# Patient Record
Sex: Female | Born: 1960 | Race: White | Hispanic: No | Marital: Married | Smoking: Never smoker
Health system: Southern US, Community
[De-identification: ages and names within clinical notes are randomized; demographics above are authoritative.]

## PROBLEM LIST (undated history)

## (undated) ENCOUNTER — Ambulatory Visit: Admission: EM | Payer: BC Managed Care – PPO

## (undated) DIAGNOSIS — D649 Anemia, unspecified: Secondary | ICD-10-CM

## (undated) DIAGNOSIS — R7303 Prediabetes: Secondary | ICD-10-CM

## (undated) DIAGNOSIS — K219 Gastro-esophageal reflux disease without esophagitis: Secondary | ICD-10-CM

## (undated) DIAGNOSIS — I10 Essential (primary) hypertension: Secondary | ICD-10-CM

---

## 2014-08-27 ENCOUNTER — Ambulatory Visit: Payer: Self-pay | Admitting: Family Medicine

## 2015-09-29 ENCOUNTER — Ambulatory Visit
Admission: RE | Admit: 2015-09-29 | Discharge: 2015-09-29 | Disposition: A | Discharge status: Home / Self Care | Payer: BC Managed Care – PPO | Source: Ambulatory Visit | Attending: Family Medicine | Admitting: Family Medicine

## 2015-09-29 ENCOUNTER — Other Ambulatory Visit: Payer: Self-pay | Admitting: Family Medicine

## 2015-09-29 DIAGNOSIS — Z1231 Encounter for screening mammogram for malignant neoplasm of breast: Secondary | ICD-10-CM | POA: Insufficient documentation

## 2016-10-04 ENCOUNTER — Other Ambulatory Visit: Payer: Self-pay | Admitting: Family Medicine

## 2016-10-04 DIAGNOSIS — Z1231 Encounter for screening mammogram for malignant neoplasm of breast: Secondary | ICD-10-CM

## 2016-10-12 ENCOUNTER — Ambulatory Visit
Admission: RE | Admit: 2016-10-12 | Discharge: 2016-10-12 | Disposition: A | Discharge status: Home / Self Care | Payer: BC Managed Care – PPO | Source: Ambulatory Visit | Attending: Family Medicine | Admitting: Family Medicine

## 2016-10-12 DIAGNOSIS — Z1231 Encounter for screening mammogram for malignant neoplasm of breast: Secondary | ICD-10-CM | POA: Diagnosis not present

## 2017-10-09 ENCOUNTER — Other Ambulatory Visit: Payer: Self-pay | Admitting: Family Medicine

## 2017-10-09 DIAGNOSIS — Z1231 Encounter for screening mammogram for malignant neoplasm of breast: Secondary | ICD-10-CM

## 2017-10-18 ENCOUNTER — Ambulatory Visit
Admission: RE | Admit: 2017-10-18 | Discharge: 2017-10-18 | Disposition: A | Discharge status: Home / Self Care | Payer: BC Managed Care – PPO | Source: Ambulatory Visit | Attending: Family Medicine | Admitting: Family Medicine

## 2017-10-18 DIAGNOSIS — Z1231 Encounter for screening mammogram for malignant neoplasm of breast: Secondary | ICD-10-CM | POA: Diagnosis not present

## 2018-10-12 ENCOUNTER — Other Ambulatory Visit: Payer: Self-pay | Admitting: Family Medicine

## 2018-10-12 DIAGNOSIS — Z1231 Encounter for screening mammogram for malignant neoplasm of breast: Secondary | ICD-10-CM

## 2018-11-06 ENCOUNTER — Encounter (INDEPENDENT_AMBULATORY_CARE_PROVIDER_SITE_OTHER): Payer: Self-pay

## 2018-11-06 ENCOUNTER — Ambulatory Visit
Admission: RE | Admit: 2018-11-06 | Discharge: 2018-11-06 | Disposition: A | Discharge status: Home / Self Care | Payer: BC Managed Care – PPO | Source: Ambulatory Visit | Attending: Family Medicine | Admitting: Family Medicine

## 2018-11-06 DIAGNOSIS — Z1231 Encounter for screening mammogram for malignant neoplasm of breast: Secondary | ICD-10-CM | POA: Diagnosis present

## 2019-10-31 ENCOUNTER — Other Ambulatory Visit: Payer: Self-pay | Admitting: Family Medicine

## 2019-10-31 DIAGNOSIS — Z1231 Encounter for screening mammogram for malignant neoplasm of breast: Secondary | ICD-10-CM

## 2019-11-12 ENCOUNTER — Ambulatory Visit
Admission: RE | Admit: 2019-11-12 | Discharge: 2019-11-12 | Disposition: A | Discharge status: Home / Self Care | Payer: BC Managed Care – PPO | Source: Ambulatory Visit | Attending: Family Medicine | Admitting: Family Medicine

## 2019-11-12 ENCOUNTER — Other Ambulatory Visit: Payer: Self-pay

## 2019-11-12 DIAGNOSIS — Z1231 Encounter for screening mammogram for malignant neoplasm of breast: Secondary | ICD-10-CM | POA: Insufficient documentation

## 2020-10-13 ENCOUNTER — Other Ambulatory Visit: Payer: Self-pay | Admitting: Family Medicine

## 2020-10-13 DIAGNOSIS — Z1231 Encounter for screening mammogram for malignant neoplasm of breast: Secondary | ICD-10-CM

## 2020-11-17 ENCOUNTER — Other Ambulatory Visit: Payer: Self-pay

## 2020-11-17 ENCOUNTER — Ambulatory Visit
Admission: RE | Admit: 2020-11-17 | Discharge: 2020-11-17 | Disposition: A | Discharge status: Home / Self Care | Payer: BC Managed Care – PPO | Source: Ambulatory Visit | Attending: Family Medicine | Admitting: Family Medicine

## 2020-11-17 DIAGNOSIS — Z1231 Encounter for screening mammogram for malignant neoplasm of breast: Secondary | ICD-10-CM | POA: Diagnosis not present

## 2021-07-29 ENCOUNTER — Other Ambulatory Visit: Payer: Self-pay | Admitting: Orthopedic Surgery

## 2021-09-28 ENCOUNTER — Other Ambulatory Visit: Payer: Self-pay

## 2021-09-28 ENCOUNTER — Encounter (HOSPITAL_BASED_OUTPATIENT_CLINIC_OR_DEPARTMENT_OTHER): Payer: Self-pay | Admitting: Orthopedic Surgery

## 2021-09-29 ENCOUNTER — Encounter (HOSPITAL_BASED_OUTPATIENT_CLINIC_OR_DEPARTMENT_OTHER)
Admission: RE | Admit: 2021-09-29 | Discharge: 2021-09-29 | Disposition: A | Discharge status: Home / Self Care | Payer: BC Managed Care – PPO | Source: Ambulatory Visit | Attending: Orthopedic Surgery | Admitting: Orthopedic Surgery

## 2021-09-29 DIAGNOSIS — Z01810 Encounter for preprocedural cardiovascular examination: Secondary | ICD-10-CM | POA: Insufficient documentation

## 2021-09-29 NOTE — Progress Notes (Signed)

## 2021-10-07 ENCOUNTER — Ambulatory Visit (HOSPITAL_BASED_OUTPATIENT_CLINIC_OR_DEPARTMENT_OTHER): Payer: BC Managed Care – PPO | Admitting: Anesthesiology

## 2021-10-07 ENCOUNTER — Ambulatory Visit (HOSPITAL_BASED_OUTPATIENT_CLINIC_OR_DEPARTMENT_OTHER)
Admission: RE | Admit: 2021-10-07 | Discharge: 2021-10-07 | Disposition: A | Discharge status: Home / Self Care | Payer: BC Managed Care – PPO | Attending: Orthopedic Surgery | Admitting: Orthopedic Surgery

## 2021-10-07 ENCOUNTER — Encounter (HOSPITAL_BASED_OUTPATIENT_CLINIC_OR_DEPARTMENT_OTHER): Payer: Self-pay | Admitting: Orthopedic Surgery

## 2021-10-07 ENCOUNTER — Encounter (HOSPITAL_BASED_OUTPATIENT_CLINIC_OR_DEPARTMENT_OTHER)
Admission: RE | Disposition: A | Discharge status: Home / Self Care | Payer: Self-pay | Source: Home / Self Care | Attending: Orthopedic Surgery

## 2021-10-07 ENCOUNTER — Other Ambulatory Visit: Payer: Self-pay

## 2021-10-07 DIAGNOSIS — G589 Mononeuropathy, unspecified: Secondary | ICD-10-CM | POA: Insufficient documentation

## 2021-10-07 DIAGNOSIS — M189 Osteoarthritis of first carpometacarpal joint, unspecified: Secondary | ICD-10-CM | POA: Insufficient documentation

## 2021-10-07 DIAGNOSIS — G5603 Carpal tunnel syndrome, bilateral upper limbs: Secondary | ICD-10-CM | POA: Diagnosis present

## 2021-10-07 DIAGNOSIS — K219 Gastro-esophageal reflux disease without esophagitis: Secondary | ICD-10-CM | POA: Diagnosis not present

## 2021-10-07 DIAGNOSIS — I10 Essential (primary) hypertension: Secondary | ICD-10-CM | POA: Diagnosis not present

## 2021-10-07 HISTORY — DX: Prediabetes: R73.03

## 2021-10-07 HISTORY — DX: Gastro-esophageal reflux disease without esophagitis: K21.9

## 2021-10-07 HISTORY — PX: CARPAL TUNNEL RELEASE: SHX101

## 2021-10-07 HISTORY — DX: Anemia, unspecified: D64.9

## 2021-10-07 HISTORY — DX: Essential (primary) hypertension: I10

## 2021-10-07 LAB — GLUCOSE, CAPILLARY: Glucose-Capillary: 80 mg/dL (ref 70–99)

## 2021-10-07 SURGERY — CARPAL TUNNEL RELEASE
Anesthesia: Regional | Site: Wrist | Laterality: Right

## 2021-10-07 MED ORDER — ONDANSETRON HCL 4 MG/2ML IJ SOLN
INTRAMUSCULAR | Status: DC | PRN
  Administered 2021-10-07: 4 mg via INTRAVENOUS

## 2021-10-07 MED ORDER — PROPOFOL 500 MG/50ML IV EMUL
INTRAVENOUS | Status: DC | PRN
  Administered 2021-10-07: 100 ug/kg/min via INTRAVENOUS

## 2021-10-07 MED ORDER — MIDAZOLAM HCL 2 MG/2ML IJ SOLN
INTRAMUSCULAR | Status: AC
  Filled 2021-10-07: qty 2

## 2021-10-07 MED ORDER — MIDAZOLAM HCL 2 MG/2ML IJ SOLN
2.0000 mg | Freq: Once | INTRAMUSCULAR | Status: AC
  Administered 2021-10-07: 09:00:00 2 mg via INTRAVENOUS

## 2021-10-07 MED ORDER — MEPERIDINE HCL 25 MG/ML IJ SOLN: 6.2500 mg | INTRAMUSCULAR | Status: DC | PRN

## 2021-10-07 MED ORDER — DEXAMETHASONE SODIUM PHOSPHATE 4 MG/ML IJ SOLN
INTRAMUSCULAR | Status: DC | PRN
  Administered 2021-10-07: 5 mg via PERINEURAL

## 2021-10-07 MED ORDER — CLONIDINE HCL (ANALGESIA) 100 MCG/ML EP SOLN
EPIDURAL | Status: DC | PRN
  Administered 2021-10-07: 80 ug

## 2021-10-07 MED ORDER — FENTANYL CITRATE (PF) 100 MCG/2ML IJ SOLN: 25.0000 ug | INTRAMUSCULAR | Status: DC | PRN

## 2021-10-07 MED ORDER — PROMETHAZINE HCL 25 MG/ML IJ SOLN: 6.2500 mg | INTRAMUSCULAR | Status: DC | PRN

## 2021-10-07 MED ORDER — ROPIVACAINE HCL 5 MG/ML IJ SOLN: INTRAMUSCULAR | Status: DC | PRN

## 2021-10-07 MED ORDER — HYDROCODONE-ACETAMINOPHEN 5-325 MG PO TABS: 1.0000 | ORAL_TABLET | Freq: Four times a day (QID) | ORAL | 0 refills | Status: DC | PRN

## 2021-10-07 MED ORDER — FENTANYL CITRATE (PF) 100 MCG/2ML IJ SOLN
100.0000 ug | Freq: Once | INTRAMUSCULAR | Status: AC
  Administered 2021-10-07: 09:00:00 50 ug via INTRAVENOUS

## 2021-10-07 MED ORDER — PROPOFOL 10 MG/ML IV BOLUS
INTRAVENOUS | Status: AC
  Filled 2021-10-07: qty 20

## 2021-10-07 MED ORDER — CEFAZOLIN SODIUM-DEXTROSE 2-4 GM/100ML-% IV SOLN
2.0000 g | INTRAVENOUS | Status: AC
  Administered 2021-10-07: 10:00:00 2 g via INTRAVENOUS

## 2021-10-07 MED ORDER — LACTATED RINGERS IV SOLN: INTRAVENOUS | Status: DC

## 2021-10-07 MED ORDER — ROPIVACAINE HCL 5 MG/ML IJ SOLN
INTRAMUSCULAR | Status: DC | PRN
  Administered 2021-10-07: 35 mL via PERINEURAL

## 2021-10-07 MED ORDER — PROPOFOL 500 MG/50ML IV EMUL
INTRAVENOUS | Status: AC
  Filled 2021-10-07: qty 50

## 2021-10-07 MED ORDER — ONDANSETRON HCL 4 MG/2ML IJ SOLN
INTRAMUSCULAR | Status: AC
  Filled 2021-10-07: qty 2

## 2021-10-07 MED ORDER — PROPOFOL 10 MG/ML IV BOLUS
INTRAVENOUS | Status: DC | PRN
  Administered 2021-10-07: 20 mg via INTRAVENOUS

## 2021-10-07 MED ORDER — FENTANYL CITRATE (PF) 100 MCG/2ML IJ SOLN
INTRAMUSCULAR | Status: AC
  Filled 2021-10-07: qty 2

## 2021-10-07 SURGICAL SUPPLY — 34 items
APL PRP STRL LF DISP 70% ISPRP (MISCELLANEOUS) ×1
BLADE SURG 15 STRL LF DISP TIS (BLADE) ×1 IMPLANT
BLADE SURG 15 STRL SS (BLADE) ×4
BNDG CMPR 9X4 STRL LF SNTH (GAUZE/BANDAGES/DRESSINGS) ×1
BNDG COHESIVE 3X5 TAN ST LF (GAUZE/BANDAGES/DRESSINGS) ×2 IMPLANT
BNDG ESMARK 4X9 LF (GAUZE/BANDAGES/DRESSINGS) ×1 IMPLANT
BNDG GAUZE ELAST 4 BULKY (GAUZE/BANDAGES/DRESSINGS) ×2 IMPLANT
CHLORAPREP W/TINT 26 (MISCELLANEOUS) ×2 IMPLANT
CORD BIPOLAR FORCEPS 12FT (ELECTRODE) ×2 IMPLANT
COVER BACK TABLE 60X90IN (DRAPES) ×2 IMPLANT
COVER MAYO STAND STRL (DRAPES) ×2 IMPLANT
CUFF TOURN SGL QUICK 18X4 (TOURNIQUET CUFF) ×2 IMPLANT
DRAPE EXTREMITY T 121X128X90 (DISPOSABLE) ×2 IMPLANT
DRAPE SURG 17X23 STRL (DRAPES) ×2 IMPLANT
DRSG PAD ABDOMINAL 8X10 ST (GAUZE/BANDAGES/DRESSINGS) ×2 IMPLANT
GAUZE SPONGE 4X4 12PLY STRL (GAUZE/BANDAGES/DRESSINGS) ×2 IMPLANT
GAUZE XEROFORM 1X8 LF (GAUZE/BANDAGES/DRESSINGS) ×2 IMPLANT
GLOVE SURG ORTHO LTX SZ8 (GLOVE) ×2 IMPLANT
GLOVE SURG UNDER POLY LF SZ8.5 (GLOVE) ×2 IMPLANT
GOWN STRL REUS W/ TWL LRG LVL3 (GOWN DISPOSABLE) ×1 IMPLANT
GOWN STRL REUS W/TWL LRG LVL3 (GOWN DISPOSABLE) ×2
GOWN STRL REUS W/TWL XL LVL3 (GOWN DISPOSABLE) ×2 IMPLANT
NDL HYPO 27GX1-1/4 (NEEDLE) IMPLANT
NEEDLE HYPO 27GX1-1/4 (NEEDLE) IMPLANT
NS IRRIG 1000ML POUR BTL (IV SOLUTION) ×2 IMPLANT
PACK BASIN DAY SURGERY FS (CUSTOM PROCEDURE TRAY) ×2 IMPLANT
STOCKINETTE 4X48 STRL (DRAPES) ×2 IMPLANT
SUT ETHILON 4 0 PS 2 18 (SUTURE) ×2 IMPLANT
SUT MERSILENE 4 0 P 3 (SUTURE) ×1 IMPLANT
SUT VICRYL 4-0 PS2 18IN ABS (SUTURE) IMPLANT
SYR BULB EAR ULCER 3OZ GRN STR (SYRINGE) ×2 IMPLANT
SYR CONTROL 10ML LL (SYRINGE) IMPLANT
TOWEL GREEN STERILE FF (TOWEL DISPOSABLE) ×2 IMPLANT
UNDERPAD 30X36 HEAVY ABSORB (UNDERPADS AND DIAPERS) ×2 IMPLANT

## 2021-10-07 NOTE — H&P (Signed)
Sherry Rowe is an 60 y.o. female.   Chief Complaint: numbness and weakness right hand HPI: Sherry Rowe is a 60 year old right-hand-dominant female referred from [facility] for consultation regarding bilateral numbness and tingling. This been going on for 25 years since her last pregnancy. She was told that it would get better. She states it has never gotten better. She is not dropping things. She has burning her fingers. She states driving causes symptoms for her. I have taken care of her sister of her carpal tunnel release. She has had nerve conductions done revealing severe carpal tunnel syndrome bilaterally. She states right is worse than left with thumb through ring fingers constantly numb. She has no history of injury to the hand or to the neck no history of car accidents. She is not awakened at night. She has not taking anything at the present time. She has had nerve conductions done at [facility] revealing no response either motor or sensory components of the nerve. She states that she has difficulty picking things up. She has a history of arthritis no history of diabetes thyroid problems or gout. Family history is positive for arthritis.  Past Medical History:  Diagnosis Date   Anemia    GERD (gastroesophageal reflux disease)    HTN (hypertension)    Prediabetes     Past Surgical History:  Procedure Laterality Date   ANTERIOR CRUCIATE LIGAMENT REPAIR     ROTATOR CUFF REPAIR      Family History  Problem Relation Age of Onset   Breast cancer Paternal Aunt    Social History:  reports that she has never smoked. She has never used smokeless tobacco. She reports that she does not drink alcohol and does not use drugs.  Allergies: No Known Allergies  No medications prior to admission.    No results found for this or any previous visit (from the past 48 hour(s)).  No results found.   Pertinent items are noted in HPI.  Height 4\' 9"  (1.448 m), weight 65.8 kg, last menstrual period  10/21/2018.  General appearance: alert, cooperative, and appears stated age Head: Normocephalic, without obvious abnormality Neck: no JVD Resp: clear to auscultation bilaterally Cardio: regular rate and rhythm, S1, S2 normal, no murmur, click, rub or gallop GI: soft, non-tender; bowel sounds normal; no masses,  no organomegaly Extremities: numbness and weakness right hand Pulses: 2+ and symmetric Skin: Skin color, texture, turgor normal. No rashes or lesions Neurologic: Alert and oriented X 3, normal strength and tone. Normal symmetric reflexes. 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 She would like to proceed to have that done. Her nerve conductions and reports from [facility] are reviewed on epic and on paper we have discussed tendon transfers for opposition using palmaris longus as a Camitz transfer. Pre-. Postoperative course been discussed along with risk and complications. She is advised to reattempting to halt the process trying with the tendon transfer to improve her opposition. She is advised that there is no guarantee to the surgery possibility of infection recurrence injury to arteries nerves tendons complete relief symptoms and dystrophy. She is seen with her husband. She would like to proceed on the right side and will be scheduled for carpal tunnel release with a Camitz transfer right hand as an outpatient under regional anesthesia.  12/20/2018 10/07/2021, 5:12 AM

## 2021-10-07 NOTE — Anesthesia Preprocedure Evaluation (Addendum)
Anesthesia Evaluation  Patient identified by MRN, date of birth, ID band Patient awake    Reviewed: Allergy & Precautions, NPO status , Patient's Chart, lab work & pertinent test results  Airway Mallampati: III  TM Distance: >3 FB Neck ROM: Full    Dental no notable dental hx. (+) Dental Advisory Given   Pulmonary neg pulmonary ROS,    Pulmonary exam normal breath sounds clear to auscultation       Cardiovascular hypertension, negative cardio ROS Normal cardiovascular exam Rhythm:Regular Rate:Normal     Neuro/Psych negative neurological ROS     GI/Hepatic Neg liver ROS, GERD  ,  Endo/Other  negative endocrine ROS  Renal/GU negative Renal ROS     Musculoskeletal negative musculoskeletal ROS (+)   Abdominal (+) + obese,   Peds  Hematology  (+) Blood dyscrasia, anemia ,   Anesthesia Other Findings   Reproductive/Obstetrics                           Anesthesia Physical Anesthesia Plan  ASA: 2  Anesthesia Plan: Regional   Post-op Pain Management: Regional block and Minimal or no pain anticipated   Induction: Intravenous  PONV Risk Score and Plan: 2 and Treatment may vary due to age or medical condition, Ondansetron, Propofol infusion, TIVA and Midazolam  Airway Management Planned: Natural Airway  Additional Equipment: None  Intra-op Plan:   Post-operative Plan:   Informed Consent: I have reviewed the patients History and Physical, chart, labs and discussed the procedure including the risks, benefits and alternatives for the proposed anesthesia with the patient or authorized representative who has indicated his/her understanding and acceptance.     Dental advisory given  Plan Discussed with: CRNA  Anesthesia Plan Comments:        Anesthesia Quick Evaluation

## 2021-10-07 NOTE — Progress Notes (Signed)
Assisted Dr. Germeroth with right, ultrasound guided, axillary block. Side rails up, monitors on throughout procedure. See vital signs in flow sheet. Tolerated Procedure well. 

## 2021-10-07 NOTE — Discharge Instructions (Addendum)

## 2021-10-07 NOTE — Brief Op Note (Signed)
10/07/2021  10:22 AM  PATIENT:  Sherry Rowe  60 y.o. female  PRE-OPERATIVE DIAGNOSIS:  right carpal tunnel syndrome  POST-OPERATIVE DIAGNOSIS:  right carpal tunnel syndrome  PROCEDURE:  Procedure(s) with comments: CARPAL TUNNEL RELEASE, TRANSFER CAMITZ, RIGHT (Right) - right carpal tunnel release, transfer camitz right  SURGEON:  Surgeon(s) and Role:    * Cindee Salt, MD - Primary    * Betha Loa, MD  PHYSICIAN ASSISTANT:   ASSISTANTS: K KuzmaMD  ANESTHESIA:   regional and IV sedation  EBL: 1 mL  BLOOD ADMINISTERED:none  DRAINS: none   LOCAL MEDICATIONS USED:  none  SPECIMEN:  No Specimen  DISPOSITION OF SPECIMEN: na  COUNTS:  YES  TOURNIQUET:   Total Tourniquet Time Documented: Upper Arm (Right) - 32 minutes Total: Upper Arm (Right) - 32 minutes   DICTATION: .Reubin Milan Dictation  PLAN OF CARE: Discharge to home after PACU  PATIENT DISPOSITION:  PACU - hemodynamically stable.

## 2021-10-07 NOTE — Op Note (Signed)
NAME: Sherry Rowe MEDICAL RECORD NO: [id number] DATE OF BIRTH: 06-26-1961 FACILITY: [facility] LOCATION: [facility] PHYSICIAN: Nicki Reaper, MD   OPERATIVE REPORT   DATE OF PROCEDURE: 10/07/21    PREOPERATIVE DIAGNOSIS: Tunnel syndrome bilateral with marked thenar atrophy bilateral   POSTOPERATIVE DIAGNOSIS: Right carpal tunnel with marked thenar atrophy   PROCEDURE: Carpal tunnel release with Camnitz transfer palmaris longus to abductor pollicis brevis   SURGEON: Cindee Salt, M.D.   ASSISTANT: Betha Loa, MD   ANESTHESIA:  Regional with sedation   INTRAVENOUS FLUIDS:  Per anesthesia flow sheet.   ESTIMATED BLOOD LOSS:  Minimal.   COMPLICATIONS:  None.   SPECIMENS:  none   TOURNIQUET TIME:    Total Tourniquet Time Documented: Upper Arm (Right) - 32 minutes Total: Upper Arm (Right) - 32 minutes    DISPOSITION:  Stable to PACU.   INDICATIONS: Patient is a 60 year old female with a history of numbness tingling with marked atrophy to the thenar eminence and carpal tunnel syndrome bilateral hands.  Nerve conductions are positive.  She is elected undergo decompression of the median nerve with Camnitz transfer.  Pre-.  Postoperative course been discussed along with risk complications.  She is aware that there is no guarantee to the surgery the possibility of infection recurrence injury to arteries nerves tendons complete relief symptoms and dystrophy.  Preoperative.  Patient seen extremity marked by both patient and surgeon antibiotic given.  An axillary block was carried out without difficulty under the direction the anesthesia department.  OPERATIVE COURSE: Patient is brought the operating room placed in a supine position with the right arm free.  Prep was done with ChloraPrep.  A 3-minute dry time was allowed and timeout taken confirm patient procedure.  The right arm was exsanguinated with an Esmarch bandage turn placed on the arm was inflated to 250  mmHg.  A longitudinal incision was made in the right palm carried down through subcutaneous tissue.  Bleeders were electrocauterized with bipolar.  The palmar fascia was identified and dissected free from the underlying tissue including branches to the index middle and ring and small fingers.  This was then isolated proximally.  Left connected to the palmaris longus tendon.  This was freed.  The flexor tendon of the ring little finger was then identified at the superficial palmar arch.  Retractors were placed retracting median nerve radially ulnar nerve ulnarly and the flexor retinaculum was then released on its ulnar border.  A right angle and sect see and sewell retractor were placed between skin and forearm fascia.  The deep structures dissected free with blunt dissection.  Flexor retinaculum proximally distal forearm fascia was then released approximately 2 to 3 cm proximal to the wrist crease under direct vision.  The palmaris longus and fascia were then circularized.  A tunnel was then made to the metacarpal phalangeal joint subcutaneously.  An incision was made on the radial aspect of the metacarpal phalangeal joint.  The palmaris longus graft was then brought to the abductor pollicis brevis insertion.  The palm incision was irrigated and closed interrupted 4-0 nylon sutures.  The palmaris longus palmar fascia tendon was then woven through the insertion of the abductor pollicis longus with the thumb held in opposition.  And sutured to the extensor tendon and then to itself with interrupted figure-of-eight horizontal mattress 4-0 Mersilene sutures.  The wound was closed with interrupted 4-0 nylon sutures.  A sterile compressive dressing dorsal thumb spica and palmar wrist splint were  applied.  On Deflation of the tourniquet all fingers immediately pink.  She was taken to the recovery room for observation in satisfactory condition.  She will be discharged home to return to the hand center [facility] in 1 week  Tylenol ibuprofen for pain with Norco for breakthrough.   Cindee Salt, MD Electronically signed, 10/07/21

## 2021-10-07 NOTE — Anesthesia Postprocedure Evaluation (Signed)
Anesthesia Post Note  Patient: Forensic psychologist  Procedure(s) Performed: CARPAL TUNNEL RELEASE, TRANSFER CAMITZ, RIGHT (Right: Wrist)     Patient location during evaluation: PACU Anesthesia Type: Regional Level of consciousness: awake and alert Pain management: pain level controlled Vital Signs Assessment: post-procedure vital signs reviewed and stable Respiratory status: spontaneous breathing Cardiovascular status: stable Anesthetic complications: no   No notable events documented.  Last Vitals:  Vitals:   10/07/21 1045 10/07/21 1100  BP: 112/73 120/68  Pulse: (!) 57 (!) 59  Resp: 11 16  Temp:  (!) 36.4 C  SpO2: 98% 95%    Last Pain:  Vitals:   10/07/21 1100  TempSrc:   PainSc: 0-No pain                 Sherry Rowe

## 2021-10-07 NOTE — Op Note (Signed)
I assisted Surgeon(s) and Role:    * Cindee Salt, MD - Primary    * Betha Loa, MD on the Procedure(s): CARPAL TUNNEL RELEASE, TRANSFER CAMITZ, RIGHT on 10/07/2021.  I provided assistance on this case as follows: retraction soft tissues, passage tendon transfer.  Electronically signed by: Betha Loa, MD Date: 10/07/2021 Time: 10:24 AM

## 2021-10-07 NOTE — Transfer of Care (Signed)
Immediate Anesthesia Transfer of Care Note  Patient: Sherry Rowe  Procedure(s) Performed: CARPAL TUNNEL RELEASE, TRANSFER CAMITZ, RIGHT (Right: Wrist)  Patient Location: PACU  Anesthesia Type:MAC combined with regional for post-op pain  Level of Consciousness: awake, alert  and oriented  Airway & Oxygen Therapy: Patient Spontanous Breathing and Patient connected to face mask oxygen  Post-op Assessment: Report given to RN and Post -op Vital signs reviewed and stable  Post vital signs: Reviewed and stable  Last Vitals:  Vitals Value Taken Time  BP    Temp 36.4 C 10/07/21 1027  Pulse 54 10/07/21 1028  Resp 11 10/07/21 1028  SpO2 99 % 10/07/21 1028  Vitals shown include unvalidated device data.  Last Pain:  Vitals:   10/07/21 0905  TempSrc: Oral  PainSc: 0-No pain         Complications: No notable events documented.

## 2021-10-07 NOTE — Anesthesia Procedure Notes (Addendum)
Anesthesia Regional Block: Axillary brachial plexus block   Pre-Anesthetic Checklist: , timeout performed,  Correct Patient, Correct Site, Correct Laterality,  Correct Procedure, Correct Position, site marked,  Risks and benefits discussed,  Surgical consent,  Pre-op evaluation,  At surgeon's request and post-op pain management  Laterality: Upper and Right  Prep: chloraprep       Needles:  Injection technique: Single-shot  Needle Type: Stimiplex          Additional Needles:   Procedures:,,,, ultrasound used (permanent image in chart),,    Narrative:  Start time: 10/07/2021 9:00 AM End time: 10/07/2021 9:19 AM Injection made incrementally with aspirations every 5 mL.  Performed by: Personally  Anesthesiologist: Lewie Loron, MD  Additional Notes: BP cuff, SpO2 and EKG monitors applied. Sedation begun. Nerve location verified with ultrasound. Anesthetic injected incrementally, slowly, and after neg aspirations under direct u/s guidance. Good perineural spread. Tolerated well.

## 2021-10-12 ENCOUNTER — Encounter (HOSPITAL_BASED_OUTPATIENT_CLINIC_OR_DEPARTMENT_OTHER): Payer: Self-pay | Admitting: Orthopedic Surgery

## 2021-10-26 ENCOUNTER — Other Ambulatory Visit: Payer: Self-pay | Admitting: Family Medicine

## 2021-10-26 DIAGNOSIS — Z1231 Encounter for screening mammogram for malignant neoplasm of breast: Secondary | ICD-10-CM

## 2021-11-24 ENCOUNTER — Ambulatory Visit: Payer: BC Managed Care – PPO

## 2022-01-17 ENCOUNTER — Ambulatory Visit
Admission: RE | Admit: 2022-01-17 | Discharge: 2022-01-17 | Disposition: A | Discharge status: Home / Self Care | Payer: BC Managed Care – PPO | Source: Ambulatory Visit | Attending: Family Medicine | Admitting: Family Medicine

## 2022-01-17 DIAGNOSIS — Z1231 Encounter for screening mammogram for malignant neoplasm of breast: Secondary | ICD-10-CM | POA: Insufficient documentation

## 2022-04-15 ENCOUNTER — Other Ambulatory Visit: Payer: Self-pay | Admitting: Orthopedic Surgery

## 2022-04-18 ENCOUNTER — Other Ambulatory Visit: Payer: Self-pay

## 2022-04-18 ENCOUNTER — Encounter (HOSPITAL_BASED_OUTPATIENT_CLINIC_OR_DEPARTMENT_OTHER): Payer: Self-pay | Admitting: Orthopedic Surgery

## 2022-04-28 ENCOUNTER — Ambulatory Visit (HOSPITAL_BASED_OUTPATIENT_CLINIC_OR_DEPARTMENT_OTHER): Payer: BC Managed Care – PPO | Admitting: Anesthesiology

## 2022-04-28 ENCOUNTER — Other Ambulatory Visit: Payer: Self-pay

## 2022-04-28 ENCOUNTER — Encounter (HOSPITAL_BASED_OUTPATIENT_CLINIC_OR_DEPARTMENT_OTHER): Payer: Self-pay | Admitting: Orthopedic Surgery

## 2022-04-28 ENCOUNTER — Encounter (HOSPITAL_BASED_OUTPATIENT_CLINIC_OR_DEPARTMENT_OTHER)
Admission: RE | Disposition: A | Discharge status: Home / Self Care | Payer: Self-pay | Source: Ambulatory Visit | Attending: Orthopedic Surgery

## 2022-04-28 ENCOUNTER — Ambulatory Visit (HOSPITAL_BASED_OUTPATIENT_CLINIC_OR_DEPARTMENT_OTHER)
Admission: RE | Admit: 2022-04-28 | Discharge: 2022-04-28 | Disposition: A | Discharge status: Home / Self Care | Payer: BC Managed Care – PPO | Source: Ambulatory Visit | Attending: Orthopedic Surgery | Admitting: Orthopedic Surgery

## 2022-04-28 DIAGNOSIS — G5602 Carpal tunnel syndrome, left upper limb: Secondary | ICD-10-CM | POA: Insufficient documentation

## 2022-04-28 DIAGNOSIS — M62542 Muscle wasting and atrophy, not elsewhere classified, left hand: Secondary | ICD-10-CM | POA: Insufficient documentation

## 2022-04-28 DIAGNOSIS — I10 Essential (primary) hypertension: Secondary | ICD-10-CM | POA: Insufficient documentation

## 2022-04-28 DIAGNOSIS — Z9889 Other specified postprocedural states: Secondary | ICD-10-CM | POA: Diagnosis not present

## 2022-04-28 DIAGNOSIS — Z79899 Other long term (current) drug therapy: Secondary | ICD-10-CM | POA: Insufficient documentation

## 2022-04-28 HISTORY — PX: TENDON TRANSFER: SHX6109

## 2022-04-28 HISTORY — PX: CARPAL TUNNEL RELEASE: SHX101

## 2022-04-28 SURGERY — CARPAL TUNNEL RELEASE
Anesthesia: Monitor Anesthesia Care | Site: Hand | Laterality: Left

## 2022-04-28 MED ORDER — MIDAZOLAM HCL 2 MG/2ML IJ SOLN
2.0000 mg | Freq: Once | INTRAMUSCULAR | Status: AC
  Administered 2022-04-28: 2 mg via INTRAVENOUS

## 2022-04-28 MED ORDER — MIDAZOLAM HCL 2 MG/2ML IJ SOLN
INTRAMUSCULAR | Status: AC
  Filled 2022-04-28: qty 2

## 2022-04-28 MED ORDER — ONDANSETRON HCL 4 MG/2ML IJ SOLN
INTRAMUSCULAR | Status: AC
  Filled 2022-04-28: qty 2

## 2022-04-28 MED ORDER — ACETAMINOPHEN 500 MG PO TABS
ORAL_TABLET | ORAL | Status: AC
  Filled 2022-04-28: qty 2

## 2022-04-28 MED ORDER — VANCOMYCIN HCL IN DEXTROSE 1-5 GM/200ML-% IV SOLN
1000.0000 mg | INTRAVENOUS | Status: AC
  Administered 2022-04-28: 1000 mg via INTRAVENOUS

## 2022-04-28 MED ORDER — FENTANYL CITRATE (PF) 100 MCG/2ML IJ SOLN
INTRAMUSCULAR | Status: AC
  Filled 2022-04-28: qty 2

## 2022-04-28 MED ORDER — FENTANYL CITRATE (PF) 100 MCG/2ML IJ SOLN: 25.0000 ug | INTRAMUSCULAR | Status: DC | PRN

## 2022-04-28 MED ORDER — 0.9 % SODIUM CHLORIDE (POUR BTL) OPTIME
TOPICAL | Status: DC | PRN
  Administered 2022-04-28: 200 mL

## 2022-04-28 MED ORDER — VANCOMYCIN HCL IN DEXTROSE 1-5 GM/200ML-% IV SOLN
INTRAVENOUS | Status: AC
  Filled 2022-04-28: qty 200

## 2022-04-28 MED ORDER — LACTATED RINGERS IV SOLN: INTRAVENOUS | Status: DC

## 2022-04-28 MED ORDER — ACETAMINOPHEN 500 MG PO TABS
1000.0000 mg | ORAL_TABLET | Freq: Four times a day (QID) | ORAL | Status: DC | PRN
  Administered 2022-04-28: 1000 mg via ORAL

## 2022-04-28 MED ORDER — FENTANYL CITRATE (PF) 100 MCG/2ML IJ SOLN
100.0000 ug | Freq: Once | INTRAMUSCULAR | Status: AC
  Administered 2022-04-28: 50 ug via INTRAVENOUS

## 2022-04-28 MED ORDER — BUPIVACAINE-EPINEPHRINE (PF) 0.5% -1:200000 IJ SOLN
INTRAMUSCULAR | Status: DC | PRN
  Administered 2022-04-28: 30 mL via PERINEURAL

## 2022-04-28 MED ORDER — CELECOXIB 200 MG PO CAPS
ORAL_CAPSULE | ORAL | Status: AC
  Filled 2022-04-28: qty 1

## 2022-04-28 MED ORDER — PROPOFOL 500 MG/50ML IV EMUL
INTRAVENOUS | Status: DC | PRN
  Administered 2022-04-28: 50 ug/kg/min via INTRAVENOUS

## 2022-04-28 MED ORDER — EPHEDRINE SULFATE (PRESSORS) 50 MG/ML IJ SOLN
INTRAMUSCULAR | Status: DC | PRN
  Administered 2022-04-28 (×2): 5 mg via INTRAVENOUS
  Administered 2022-04-28: 10 mg via INTRAVENOUS

## 2022-04-28 MED ORDER — LIDOCAINE-EPINEPHRINE (PF) 1.5 %-1:200000 IJ SOLN
INTRAMUSCULAR | Status: DC | PRN
  Administered 2022-04-28: 5 mL

## 2022-04-28 MED ORDER — LIDOCAINE 2% (20 MG/ML) 5 ML SYRINGE
INTRAMUSCULAR | Status: AC
  Filled 2022-04-28: qty 5

## 2022-04-28 MED ORDER — PROPOFOL 10 MG/ML IV BOLUS
INTRAVENOUS | Status: AC
  Filled 2022-04-28: qty 20

## 2022-04-28 MED ORDER — PROPOFOL 10 MG/ML IV BOLUS
INTRAVENOUS | Status: DC | PRN
  Administered 2022-04-28: 30 mg via INTRAVENOUS

## 2022-04-28 MED ORDER — CELECOXIB 200 MG PO CAPS
200.0000 mg | ORAL_CAPSULE | Freq: Once | ORAL | Status: AC
  Administered 2022-04-28: 200 mg via ORAL

## 2022-04-28 MED ORDER — LIDOCAINE HCL (CARDIAC) PF 100 MG/5ML IV SOSY
PREFILLED_SYRINGE | INTRAVENOUS | Status: DC | PRN
  Administered 2022-04-28: 20 mg via INTRAVENOUS

## 2022-04-28 MED ORDER — HYDROCODONE-ACETAMINOPHEN 5-325 MG PO TABS
ORAL_TABLET | ORAL | 0 refills | Status: AC
Start: 2022-04-28 — End: ?

## 2022-04-28 MED ORDER — EPHEDRINE 5 MG/ML INJ
INTRAVENOUS | Status: AC
  Filled 2022-04-28: qty 5

## 2022-04-28 MED ORDER — AMISULPRIDE (ANTIEMETIC) 5 MG/2ML IV SOLN: 10.0000 mg | Freq: Once | INTRAVENOUS | Status: DC | PRN

## 2022-04-28 MED ORDER — CLONIDINE HCL (ANALGESIA) 100 MCG/ML EP SOLN
EPIDURAL | Status: DC | PRN
  Administered 2022-04-28: 50 ug

## 2022-04-28 SURGICAL SUPPLY — 49 items
APL PRP STRL LF DISP 70% ISPRP (MISCELLANEOUS) ×1
BLADE SURG 15 STRL LF DISP TIS (BLADE) ×2 IMPLANT
BLADE SURG 15 STRL SS (BLADE) ×4
BNDG CMPR 9X4 STRL LF SNTH (GAUZE/BANDAGES/DRESSINGS) ×1
BNDG ELASTIC 3X5.8 VLCR STR LF (GAUZE/BANDAGES/DRESSINGS) ×2 IMPLANT
BNDG ESMARK 4X9 LF (GAUZE/BANDAGES/DRESSINGS) ×2 IMPLANT
BNDG GAUZE DERMACEA FLUFF (GAUZE/BANDAGES/DRESSINGS) ×1
BNDG GAUZE DERMACEA FLUFF 4 (GAUZE/BANDAGES/DRESSINGS) ×1 IMPLANT
BNDG GZE DERMACEA 4 6PLY (GAUZE/BANDAGES/DRESSINGS) ×1
CHLORAPREP W/TINT 26 (MISCELLANEOUS) ×2 IMPLANT
CORD BIPOLAR FORCEPS 12FT (ELECTRODE) ×2 IMPLANT
COVER BACK TABLE 60X90IN (DRAPES) ×2 IMPLANT
COVER MAYO STAND STRL (DRAPES) ×2 IMPLANT
CUFF TOURN SGL QUICK 18X4 (TOURNIQUET CUFF) ×2 IMPLANT
DRAPE EXTREMITY T 121X128X90 (DISPOSABLE) ×2 IMPLANT
DRAPE SURG 17X23 STRL (DRAPES) ×2 IMPLANT
DRSG PAD ABDOMINAL 8X10 ST (GAUZE/BANDAGES/DRESSINGS) ×2 IMPLANT
GAUZE SPONGE 4X4 12PLY STRL (GAUZE/BANDAGES/DRESSINGS) ×2 IMPLANT
GAUZE XEROFORM 1X8 LF (GAUZE/BANDAGES/DRESSINGS) ×2 IMPLANT
GLOVE BIO SURGEON STRL SZ 6.5 (GLOVE) ×1 IMPLANT
GLOVE BIO SURGEON STRL SZ7.5 (GLOVE) ×2 IMPLANT
GLOVE BIOGEL PI IND STRL 8 (GLOVE) ×1 IMPLANT
GLOVE BIOGEL PI IND STRL 8.5 (GLOVE) IMPLANT
GLOVE BIOGEL PI INDICATOR 8 (GLOVE) ×1
GLOVE BIOGEL PI INDICATOR 8.5 (GLOVE) ×1
GLOVE SURG ORTHO 8.0 STRL STRW (GLOVE) ×1 IMPLANT
GOWN STRL REUS W/ TWL LRG LVL3 (GOWN DISPOSABLE) ×1 IMPLANT
GOWN STRL REUS W/TWL LRG LVL3 (GOWN DISPOSABLE) ×2
GOWN STRL REUS W/TWL XL LVL3 (GOWN DISPOSABLE) ×3 IMPLANT
NDL HYPO 25X1 1.5 SAFETY (NEEDLE) ×1 IMPLANT
NEEDLE HYPO 25X1 1.5 SAFETY (NEEDLE) ×2 IMPLANT
NS IRRIG 1000ML POUR BTL (IV SOLUTION) ×2 IMPLANT
PACK BASIN DAY SURGERY FS (CUSTOM PROCEDURE TRAY) ×2 IMPLANT
PAD CAST 3X4 CTTN HI CHSV (CAST SUPPLIES) ×1 IMPLANT
PADDING CAST ABS 3INX4YD NS (CAST SUPPLIES) ×1
PADDING CAST ABS 4INX4YD NS (CAST SUPPLIES) ×1
PADDING CAST ABS COTTON 3X4 (CAST SUPPLIES) IMPLANT
PADDING CAST ABS COTTON 4X4 ST (CAST SUPPLIES) ×1 IMPLANT
PADDING CAST COTTON 3X4 STRL (CAST SUPPLIES) ×2
SLING ARM FOAM STRAP LRG (SOFTGOODS) ×1 IMPLANT
SPLINT PLASTER CAST XFAST 3X15 (CAST SUPPLIES) IMPLANT
SPLINT PLASTER XTRA FASTSET 3X (CAST SUPPLIES) ×20
STOCKINETTE 4X48 STRL (DRAPES) ×2 IMPLANT
SUT ETHILON 4 0 PS 2 18 (SUTURE) ×3 IMPLANT
SUT MERSILENE 4 0 P 3 (SUTURE) ×1 IMPLANT
SYR BULB EAR ULCER 3OZ GRN STR (SYRINGE) ×2 IMPLANT
SYR CONTROL 10ML LL (SYRINGE) ×2 IMPLANT
TOWEL GREEN STERILE FF (TOWEL DISPOSABLE) ×4 IMPLANT
UNDERPAD 30X36 HEAVY ABSORB (UNDERPADS AND DIAPERS) ×2 IMPLANT

## 2022-04-28 NOTE — Anesthesia Procedure Notes (Addendum)
Anesthesia Regional Block: Axillary brachial plexus block   Pre-Anesthetic Checklist: , timeout performed,  Correct Patient, Correct Site, Correct Laterality,  Correct Procedure, Correct Position, site marked,  Risks and benefits discussed,  Surgical consent,  Pre-op evaluation,  At surgeon's request and post-op pain management  Laterality: Left  Prep: chloraprep       Needles:  Injection technique: Single-shot  Needle Type: Echogenic Stimulator Needle     Needle Length: 9cm  Needle Gauge: 21     Additional Needles:   Procedures:, nerve stimulator,,, ultrasound used (permanent image in chart),,     Nerve Stimulator or Paresthesia:  Response: MC, median, radial and ulnar responses, 0.5 mA  Additional Responses:   Narrative:  Start time: 04/28/2022 8:56 AM End time: 04/28/2022 9:03 AM Injection made incrementally with aspirations every 5 mL.  Performed by: Personally  Anesthesiologist: Marcene Duos, MD  Additional Notes: Additional 5cc's 1.5%lido with epi injected subq on medial upper arm for median antebrachial cutaneous coverage for tourniquet.

## 2022-04-28 NOTE — Discharge Instructions (Addendum)

## 2022-04-28 NOTE — Transfer of Care (Signed)
Immediate Anesthesia Transfer of Care Note  Patient: Sherry Rowe  Procedure(s) Performed: LEFT CARPAL TUNNEL RELEASE (Left: Hand) CAMITZ TRANSFER (Left: Hand)  Patient Location: PACU  Anesthesia Type:MAC and Regional  Level of Consciousness: awake, alert  and oriented  Airway & Oxygen Therapy: Patient Spontanous Breathing and Patient connected to face mask oxygen  Post-op Assessment: Report given to RN and Post -op Vital signs reviewed and stable  Post vital signs: Reviewed and stable  Last Vitals:  Vitals Value Taken Time  BP    Temp    Pulse 69 04/28/22 1122  Resp    SpO2 99 % 04/28/22 1122  Vitals shown include unvalidated device data.  Last Pain:  Vitals:   04/28/22 0755  TempSrc: Oral  PainSc: 0-No pain      Patients Stated Pain Goal: 3 (61/68/37 2902)  Complications: No notable events documented.

## 2022-04-28 NOTE — Op Note (Signed)
I assisted Surgeon(s) and Role:    * Betha Loa, MD - Primary    Cindee Salt, MD - Assisting on the Procedure(s): LEFT CARPAL TUNNEL RELEASE CAMITZ TRANSFER on 04/28/2022.  I provided assistance on this case as follows: Set up, approach, identification isolation of the palmaris longus tendon and extension with the palmar fascia action for relation of this protection of the nerves release of the median nerve transfer of the tendon for opposition maintaining position for insertion of the tendon transfer, closure of the wounds and application of the dressing and splints  Electronically signed by: Cindee Salt, MD Date: 04/28/2022 Time: 11:19 AM

## 2022-04-28 NOTE — H&P (Signed)
Sherry Rowe is an 61 y.o. female.   Chief Complaint: carpal tunnel syndrome HPI: 61 yo female with numbness and tingling left hand.  Positive nerve conduction studies.  Atrophy of thenar eminence.  Has had right carpal tunnel release and Camitz transfer.  She wishes to have the same procedure on the left side.  Allergies:  Allergies  Allergen Reactions   Augmentin [Amoxicillin-Pot Clavulanate]     "High liver function numbers"   Lisinopril Swelling   Percocet [Oxycodone-Acetaminophen] Itching    Past Medical History:  Diagnosis Date   Anemia    GERD (gastroesophageal reflux disease)    HTN (hypertension)    Prediabetes     Past Surgical History:  Procedure Laterality Date   ANTERIOR CRUCIATE LIGAMENT REPAIR     CARPAL TUNNEL RELEASE Right 10/07/2021   Procedure: CARPAL TUNNEL RELEASE, TRANSFER CAMITZ, RIGHT;  Surgeon: Cindee Salt, MD;  Location: [facility];  Service: Orthopedics;  Laterality: Right;  right carpal tunnel release, transfer camitz right   ROTATOR CUFF REPAIR      Family History: Family History  Problem Relation Age of Onset   Breast cancer Paternal Aunt     Social History:   reports that she has never smoked. She has never used smokeless tobacco. She reports that she does not drink alcohol and does not use drugs.  Medications: Medications Prior to Admission  Medication Sig Dispense Refill   amLODipine (NORVASC) 10 MG tablet Take 10 mg by mouth daily.     fluticasone (FLONASE) 50 MCG/ACT nasal spray Place into both nostrils daily.     olopatadine (PATADAY) 0.1 % ophthalmic solution 1 drop 2 (two) times daily.      No results found for this or any previous visit (from the past 48 hour(s)).  No results found.    Blood pressure (!) 104/53, pulse (!) 58, temperature 98.2 F (36.8 C), temperature source Oral, resp. rate 10, height 4\' 9"  (1.448 m), weight 68.7 kg, last menstrual period 10/21/2018, SpO2 99 %.  General appearance:  alert, cooperative, and appears stated age Head: Normocephalic, without obvious abnormality, atraumatic Neck: supple, symmetrical, trachea midline Extremities: Intact capillary refill all digits.  +epl/fpl/io.  No wounds.  Pulses: 2+ and symmetric Skin: Skin color, texture, turgor normal. No rashes or lesions Neurologic: Grossly normal Incision/Wound: none  Assessment/Plan Left carpal tunnel syndrome and thenar atrophy.  Plan carpal tunnel release and Camitz transfer.  Risks, benefits and alternatives of surgery were discussed including risks of blood loss, infection, damage to nerves/vessels/tendons/ligament/bone, failure of surgery, need for additional surgery, complication with wound healing, stiffness, recurrence.  She voiced understanding of these risks and elected to proceed.    12/20/2018 04/28/2022, 9:50 AM

## 2022-04-28 NOTE — Op Note (Signed)
NAME: Sherry Rowe MEDICAL RECORD NO: [id number] DATE OF BIRTH: 1960-11-08 FACILITY: [facility] LOCATION: [facility] PHYSICIAN: Tami Ribas, MD   OPERATIVE REPORT   DATE OF PROCEDURE: 04/28/22    PREOPERATIVE DIAGNOSIS: Left carpal tunnel syndrome with thenar atrophy   POSTOPERATIVE DIAGNOSIS: Left carpal tunnel syndrome with thenar atrophy   PROCEDURE: 1.  Left carpal tunnel release 2.  Left Camitz transfer   SURGEON:  Betha Loa, M.D.   ASSISTANT: Cindee Salt, MD   ANESTHESIA:  Regional with sedation   INTRAVENOUS FLUIDS:  Per anesthesia flow sheet.   ESTIMATED BLOOD LOSS:  Minimal.   COMPLICATIONS:  None.   SPECIMENS:  none   TOURNIQUET TIME:    Total Tourniquet Time Documented: Upper Arm (Left) - 56 minutes Total: Upper Arm (Left) - 56 minutes    DISPOSITION:  Stable to PACU.   INDICATIONS: 61 year old female with left carpal tunnel syndrome.  She has numbness and tingling hands and positive nerve conduction studies.  She has thenar atrophy.  She has had right carpal tunnel release with Camitz transfer.  She wishes to proceed with the same procedure for the left.  Risks, benefits and alternatives of surgery were discussed including the risks of blood loss, infection, damage to nerves, vessels, tendons, ligaments, bone for surgery, need for additional surgery, complications with wound healing, continued pain, stiffness.  She voiced understanding of these risks and elected to proceed.  OPERATIVE COURSE:  After being identified preoperatively by myself,  the patient and I agreed on the procedure and site of the procedure.  The surgical site was marked.  Surgical consent had been signed. Preoperative IV antibiotic prophylaxis was given. She was transferred to the operating room and placed on the operating table in supine position with the left upper extremity on an arm board.  Sedation was induced by the anesthesiologist. A regional block had been  performed by anesthesia in preoperative holding.    Left upper extremity was prepped and draped in normal sterile orthopedic fashion.  A surgical pause was performed between the surgeons, anesthesia, and operating room staff and all were in agreement as to the patient, procedure, and site of procedure.  Tourniquet at the proximal aspect of the extremity was inflated to 250 mmHg after exsanguination of the arm with an Esmarch bandage.  Incision was made over the transverse carpal ligament into the subcutaneous tissues.  Care was taken to preserve the palmar fascia.  The palmar fascia was traced out to the index long and ring fingers.  Any fascia with good bulk was harvested.  The fascia was harvested to its connection with the palmaris longus tendon.  Good length for the tendon transfer was obtained.  The transverse carpal ligament was identified distally.  The flexor tendon to the ring finger was identified and retracted radially.  The motor branch was identified.  The transverse carpal ligament was incised from distal to proximal under direct visualization.  The distal aspect of the volar antebrachial fascia was released under direct visualization as well.  There was an hourglass deformity to the nerve.  Motor branch was intact.  An incision was made at the radial side of the thumb at the level of the MP joint.  This was carried into subcutaneous tissues by spreading technique.  A tunnel was made using a hemostat for the transfer.  The palmaris longus and fascial tendon graft was passed into the tunnel and up to the MP joint of the thumb.  The  wound in the palm was copiously irrigated with sterile saline and closed with 4-0 nylon in a horizontal mattress fashion.  The tendon transfer was then woven into the insertion of the abductor pollicis brevis.  This was secured with 4-0 Mersilene suture.  The thumb was held in abduction and opposition while this was done to create appropriate tension.  The tendon graft was  then sutured back onto itself using a 4-0 Mersilene suture.  The wound was then closed with 4-0 nylon in a horizontal mattress fashion.  The wounds were dressed with sterile Xeroform and 4 x 4's and wrapped with a Kerlix bandage.  A thumb spica splint was placed and wrapped with Kerlix and Ace bandage.  The tourniquet was deflated at 56 minutes.  Fingertips were pink with brisk capillary refill after deflation of tourniquet.  The operative  drapes were broken down.  The patient was awoken from anesthesia safely.  She was transferred back to the stretcher and taken to PACU in stable condition.  I will see her back in the office in 1 week for postoperative followup.  I will give her a prescription for Norco 5/325 1-2 tabs PO q6 hours prn pain, dispense # 20.   Betha Loa, MD Electronically signed, 04/28/22

## 2022-04-28 NOTE — Anesthesia Postprocedure Evaluation (Signed)
Anesthesia Post Note  Patient: Forensic psychologist  Procedure(s) Performed: LEFT CARPAL TUNNEL RELEASE (Left: Hand) CAMITZ TRANSFER (Left: Hand)     Patient location during evaluation: PACU Anesthesia Type: Regional Level of consciousness: awake and alert Pain management: pain level controlled Vital Signs Assessment: post-procedure vital signs reviewed and stable Respiratory status: spontaneous breathing, nonlabored ventilation, respiratory function stable and patient connected to nasal cannula oxygen Cardiovascular status: stable and blood pressure returned to baseline Postop Assessment: no apparent nausea or vomiting Anesthetic complications: no   No notable events documented.  Last Vitals:  Vitals:   04/28/22 1145 04/28/22 1202  BP: 102/61 124/73  Pulse: (!) 59 63  Resp: 12 14  Temp:  (!) 36.2 C  SpO2: 96% 95%    Last Pain:  Vitals:   04/28/22 1202  TempSrc:   PainSc: 0-No pain                 Kennieth Rad

## 2022-04-28 NOTE — Anesthesia Preprocedure Evaluation (Signed)
Anesthesia Evaluation  Patient identified by MRN, date of birth, ID band Patient awake    Reviewed: Allergy & Precautions, NPO status , Patient's Chart, lab work & pertinent test results  Airway Mallampati: II  TM Distance: >3 FB Neck ROM: Full    Dental  (+) Dental Advisory Given   Pulmonary neg pulmonary ROS,    breath sounds clear to auscultation       Cardiovascular hypertension, Pt. on medications  Rhythm:Regular Rate:Normal     Neuro/Psych negative neurological ROS     GI/Hepatic Neg liver ROS, GERD  ,  Endo/Other  negative endocrine ROS  Renal/GU negative Renal ROS     Musculoskeletal   Abdominal   Peds  Hematology negative hematology ROS (+)   Anesthesia Other Findings   Reproductive/Obstetrics                             Anesthesia Physical Anesthesia Plan  ASA: 2  Anesthesia Plan: Regional and MAC   Post-op Pain Management: Regional block*, Tylenol PO (pre-op)* and Celebrex PO (pre-op)*   Induction:   PONV Risk Score and Plan: 2 and Propofol infusion and Ondansetron  Airway Management Planned: Natural Airway and Simple Face Mask  Additional Equipment:   Intra-op Plan:   Post-operative Plan:   Informed Consent: I have reviewed the patients History and Physical, chart, labs and discussed the procedure including the risks, benefits and alternatives for the proposed anesthesia with the patient or authorized representative who has indicated his/her understanding and acceptance.       Plan Discussed with:   Anesthesia Plan Comments:         Anesthesia Quick Evaluation

## 2022-05-02 ENCOUNTER — Encounter (HOSPITAL_BASED_OUTPATIENT_CLINIC_OR_DEPARTMENT_OTHER): Payer: Self-pay | Admitting: Orthopedic Surgery

## 2022-12-07 ENCOUNTER — Other Ambulatory Visit: Payer: Self-pay | Admitting: Family Medicine

## 2022-12-07 DIAGNOSIS — Z1231 Encounter for screening mammogram for malignant neoplasm of breast: Secondary | ICD-10-CM

## 2023-01-19 ENCOUNTER — Ambulatory Visit: Payer: BC Managed Care – PPO

## 2023-01-23 ENCOUNTER — Ambulatory Visit
Admission: RE | Admit: 2023-01-23 | Discharge: 2023-01-23 | Disposition: A | Discharge status: Home / Self Care | Payer: BC Managed Care – PPO | Source: Ambulatory Visit | Attending: Family Medicine | Admitting: Family Medicine

## 2023-01-23 DIAGNOSIS — Z1231 Encounter for screening mammogram for malignant neoplasm of breast: Secondary | ICD-10-CM | POA: Insufficient documentation

## 2023-07-13 IMAGING — MG MM DIGITAL SCREENING BILAT W/ TOMO AND CAD
8 series · 8 of 24 positions shown · non-contrast
Comparison: Previous exam(s).

CLINICAL DATA: Screening.

EXAM:
DIGITAL SCREENING BILATERAL MAMMOGRAM WITH TOMOSYNTHESIS AND CAD
TECHNIQUE: Bilateral screening digital craniocaudal and mediolateral oblique
mammograms were obtained. Bilateral screening digital breast
tomosynthesis was performed. The images were evaluated with
computer-aided detection.

[R CC synth-2D]
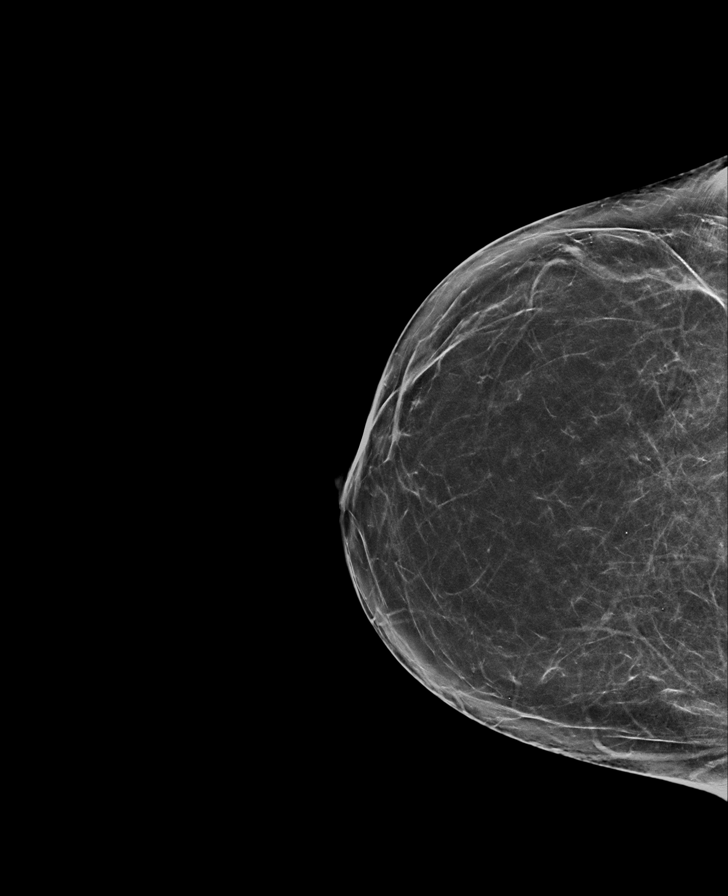

[R MLO synth-2D]
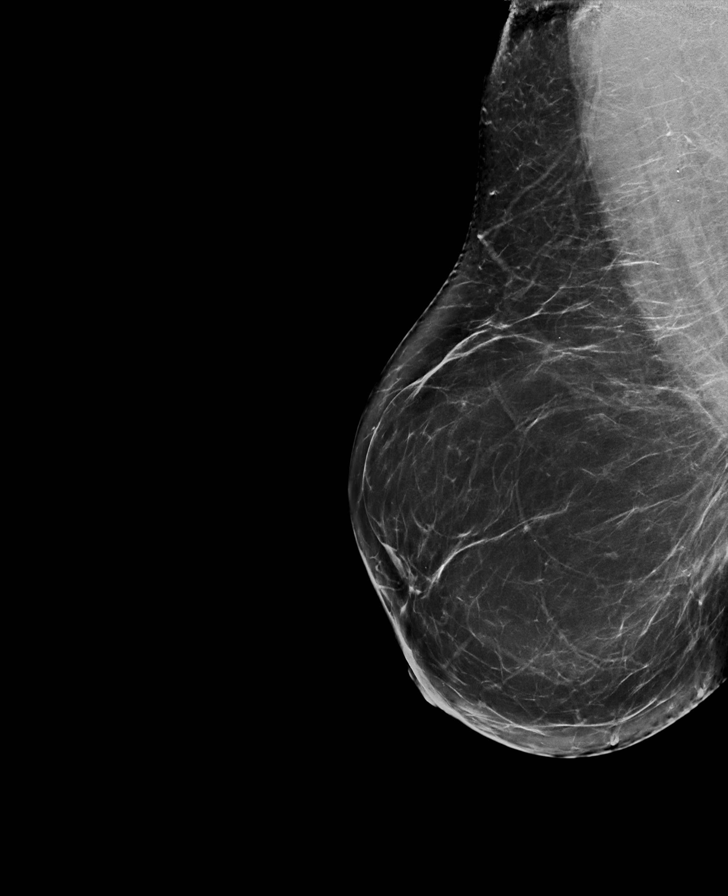

[L CC synth-2D]
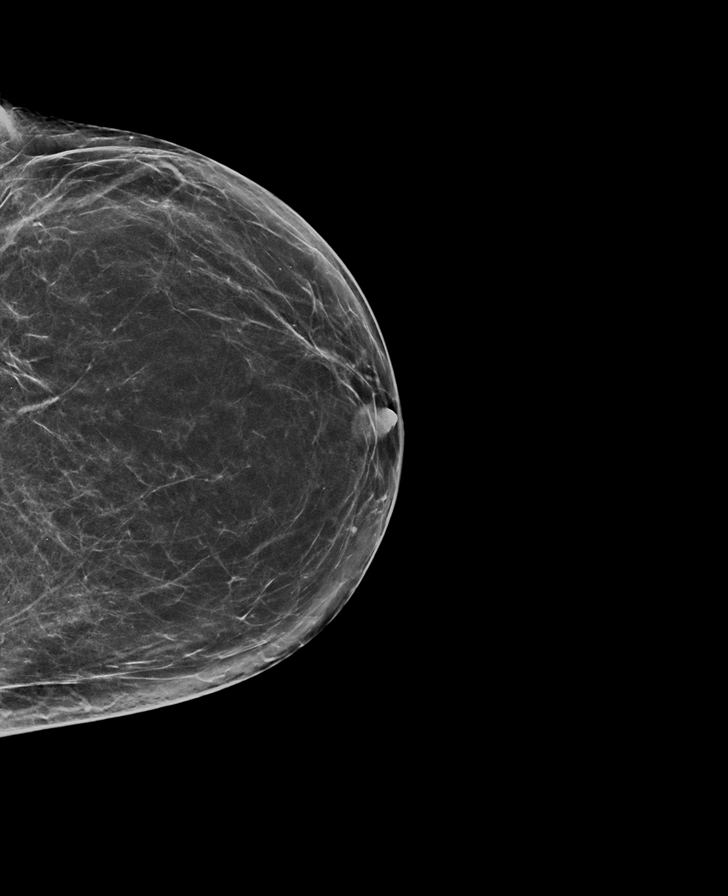

[L MLO synth-2D]
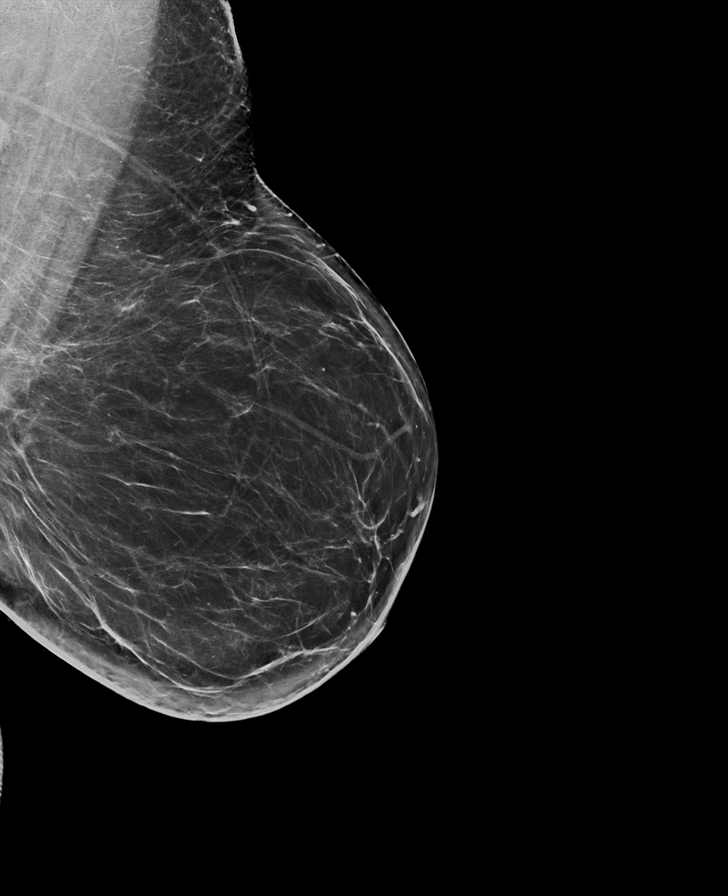

[R CC tomo · tomo slice 35/69.0]
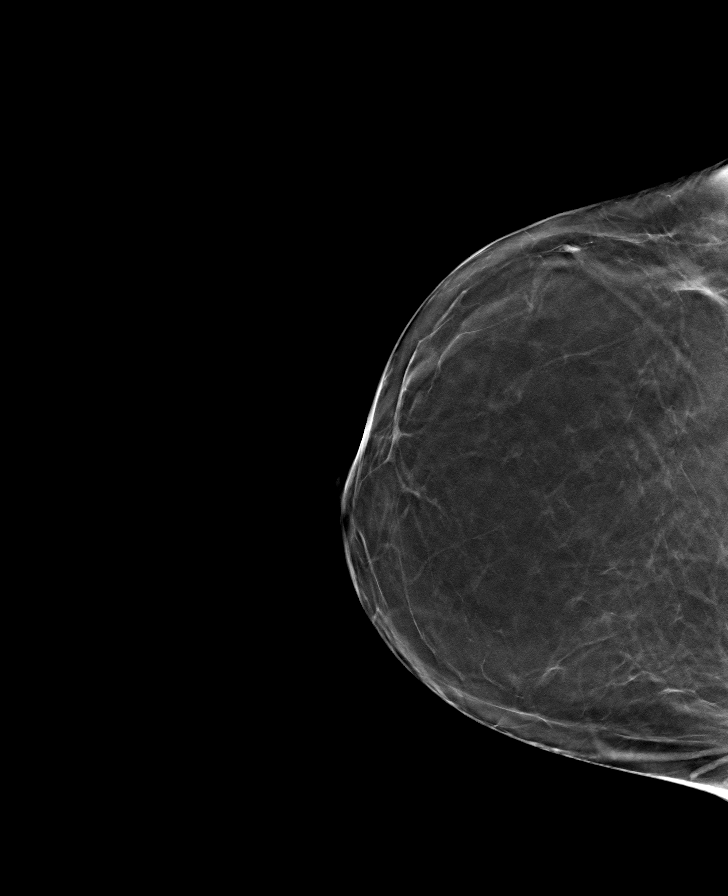

[L CC tomo · tomo slice 36/71.0]
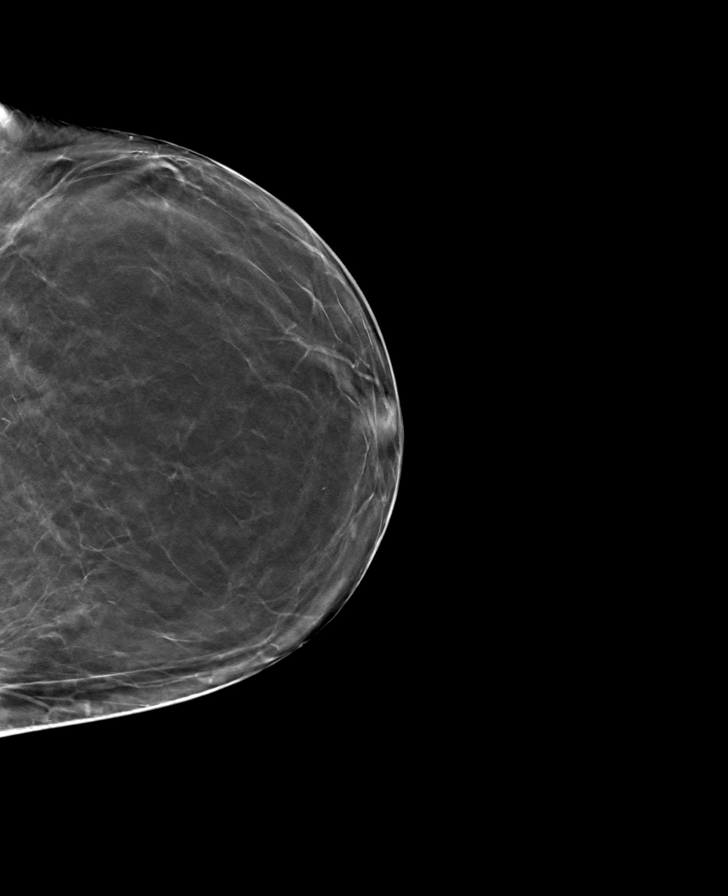

[R MLO tomo · tomo slice 43/86.0]
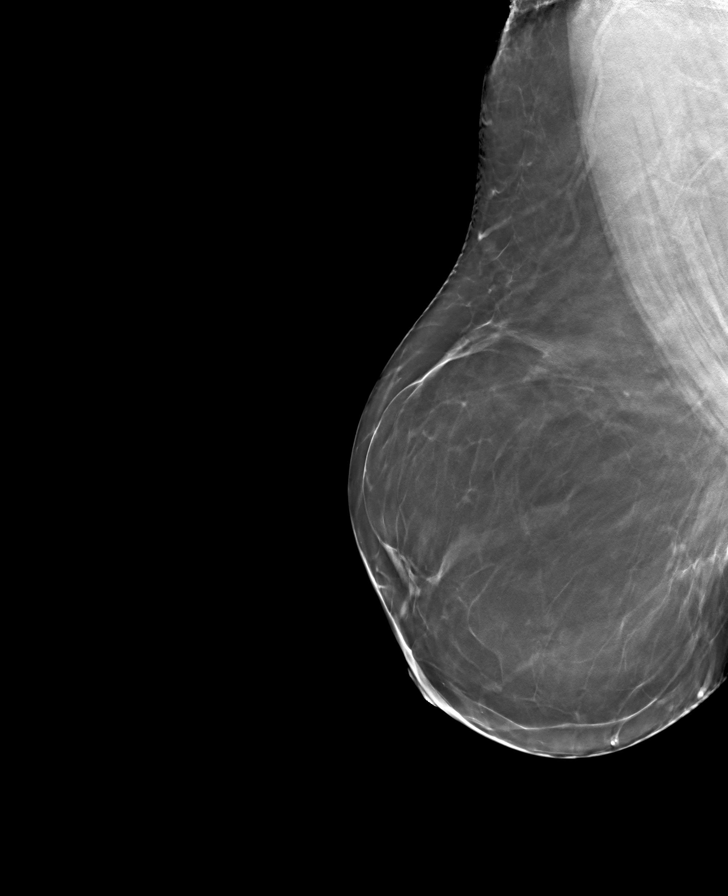

[L MLO tomo · tomo slice 43/84.0]
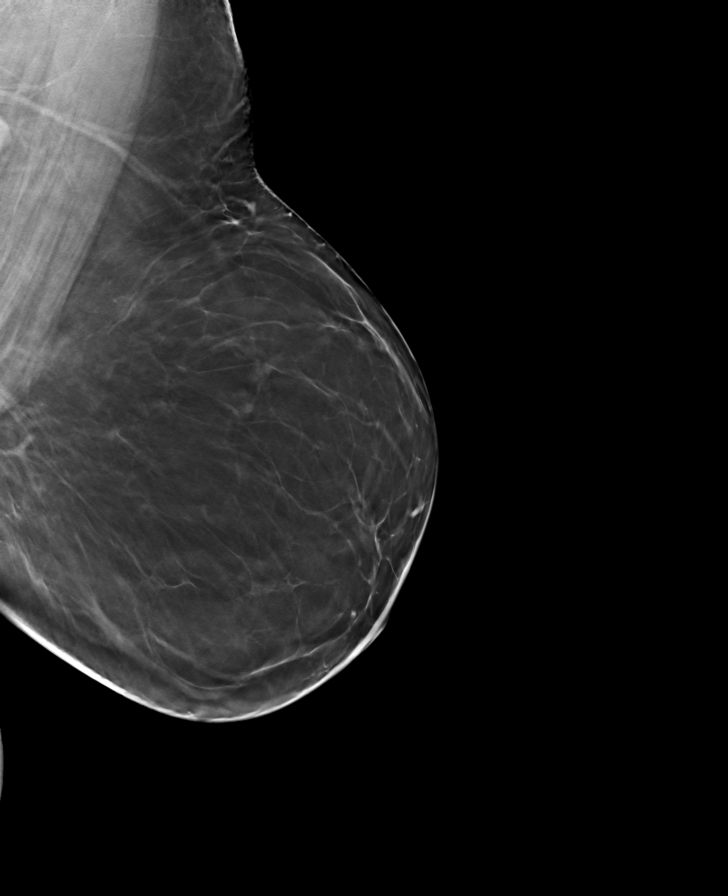

[8 of 24 positions shown; findings below may reference images not displayed]

ACR Breast Density Category b: There are scattered areas of
fibroglandular density.
FINDINGS: There are no findings suspicious for malignancy.
IMPRESSION: No mammographic evidence of malignancy. A result letter of this
screening mammogram will be mailed directly to the patient.

RECOMMENDATION:
Screening mammogram in one year. (Code:51-O-LD2)

BI-RADS CATEGORY  1: Negative.

## 2023-12-11 ENCOUNTER — Other Ambulatory Visit: Payer: Self-pay | Admitting: Family Medicine

## 2023-12-11 DIAGNOSIS — Z1231 Encounter for screening mammogram for malignant neoplasm of breast: Secondary | ICD-10-CM

## 2024-01-24 ENCOUNTER — Ambulatory Visit
Admission: RE | Admit: 2024-01-24 | Discharge: 2024-01-24 | Disposition: A | Discharge status: Home / Self Care | Payer: Self-pay | Source: Ambulatory Visit | Attending: Family Medicine | Admitting: Family Medicine

## 2024-01-24 DIAGNOSIS — Z1231 Encounter for screening mammogram for malignant neoplasm of breast: Secondary | ICD-10-CM | POA: Diagnosis present
